# Patient Record
Sex: Female | Born: 1970 | Race: Black or African American | Hispanic: No | Marital: Married | Smoking: Never smoker
Health system: Southern US, Community
[De-identification: ages and names within clinical notes are randomized; demographics above are authoritative.]

## PROBLEM LIST (undated history)

## (undated) DIAGNOSIS — K8020 Calculus of gallbladder without cholecystitis without obstruction: Secondary | ICD-10-CM

## (undated) DIAGNOSIS — C55 Malignant neoplasm of uterus, part unspecified: Secondary | ICD-10-CM

## (undated) HISTORY — PX: THORACOTOMY/LOBECTOMY: SHX6116

## (undated) HISTORY — PX: ABDOMINAL HYSTERECTOMY: SHX81

---

## 2010-07-22 ENCOUNTER — Ambulatory Visit: Payer: Self-pay | Admitting: Diagnostic Radiology

## 2010-07-22 ENCOUNTER — Emergency Department (HOSPITAL_BASED_OUTPATIENT_CLINIC_OR_DEPARTMENT_OTHER): Admission: EM | Admit: 2010-07-22 | Discharge: 2010-07-22 | Payer: Self-pay | Admitting: Emergency Medicine

## 2010-10-02 ENCOUNTER — Emergency Department (HOSPITAL_BASED_OUTPATIENT_CLINIC_OR_DEPARTMENT_OTHER): Admission: EM | Admit: 2010-10-02 | Discharge: 2010-09-25 | Payer: Self-pay | Admitting: Emergency Medicine

## 2011-01-06 LAB — URINALYSIS, ROUTINE W REFLEX MICROSCOPIC
Leukocytes, UA: NEGATIVE
Protein, ur: NEGATIVE mg/dL
Urobilinogen, UA: 1 mg/dL (ref 0.0–1.0)

## 2011-01-06 LAB — URINE MICROSCOPIC-ADD ON

## 2011-01-06 LAB — DIFFERENTIAL
Basophils Absolute: 0.1 10*3/uL (ref 0.0–0.1)
Eosinophils Absolute: 0.4 10*3/uL (ref 0.0–0.7)
Lymphocytes Relative: 48 % — ABNORMAL HIGH (ref 12–46)
Lymphs Abs: 7 10*3/uL — ABNORMAL HIGH (ref 0.7–4.0)
Monocytes Absolute: 0.9 10*3/uL (ref 0.1–1.0)
Neutro Abs: 6 10*3/uL (ref 1.7–7.7)

## 2011-01-06 LAB — COMPREHENSIVE METABOLIC PANEL
ALT: 30 U/L (ref 0–35)
Alkaline Phosphatase: 88 U/L (ref 39–117)
CO2: 28 mEq/L (ref 19–32)
Calcium: 9.6 mg/dL (ref 8.4–10.5)
GFR calc non Af Amer: >60 mL/min (ref >60)
Glucose, Bld: 108 mg/dL — ABNORMAL HIGH (ref 70–99)
Sodium: 143 mEq/L (ref 135–145)
Total Bilirubin: 0.5 mg/dL (ref 0.3–1.2)

## 2011-01-06 LAB — CBC
HCT: 39 % (ref 36.0–46.0)
Hemoglobin: 13.3 g/dL (ref 12.0–15.0)
MCH: 31 pg (ref 26.0–34.0)
MCHC: 34 g/dL (ref 30.0–36.0)

## 2011-01-08 LAB — COMPREHENSIVE METABOLIC PANEL
ALT: 414 U/L — ABNORMAL HIGH (ref 0–35)
AST: 354 U/L — ABNORMAL HIGH (ref 0–37)
Alkaline Phosphatase: 177 U/L — ABNORMAL HIGH (ref 39–117)
CO2: 24 mEq/L (ref 19–32)
GFR calc Af Amer: >60 mL/min (ref >60)
GFR calc non Af Amer: >60 mL/min (ref >60)
Glucose, Bld: 86 mg/dL (ref 70–99)
Potassium: 4.3 mEq/L (ref 3.5–5.1)
Sodium: 140 mEq/L (ref 135–145)
Total Protein: 8 g/dL (ref 6.0–8.3)

## 2011-01-08 LAB — CBC
HCT: 40.3 % (ref 36.0–46.0)
Hemoglobin: 13.2 g/dL (ref 12.0–15.0)
WBC: 5.8 10*3/uL (ref 4.0–10.5)

## 2011-01-08 LAB — URINALYSIS, ROUTINE W REFLEX MICROSCOPIC
Glucose, UA: NEGATIVE mg/dL
Ketones, ur: 15 mg/dL — AB
Protein, ur: NEGATIVE mg/dL

## 2011-01-08 LAB — DIFFERENTIAL
Basophils Relative: 1 % (ref 0–1)
Eosinophils Absolute: 0.2 10*3/uL (ref 0.0–0.7)
Eosinophils Relative: 4 % (ref 0–5)
Monocytes Relative: 9 % (ref 3–12)
Neutrophils Relative %: 45 % (ref 43–77)

## 2011-01-08 LAB — URINE MICROSCOPIC-ADD ON

## 2011-12-14 IMAGING — US US ABDOMEN COMPLETE
1 series · 14 of 25 positions shown · non-contrast
Comparison: None

CLINICAL DATA: Abdominal pain.

COMPLETE ABDOMINAL ULTRASOUND

[Series 1: us abdomen complete · 0.28mm/px · 14 of 80 slices shown]
[im 1/80]
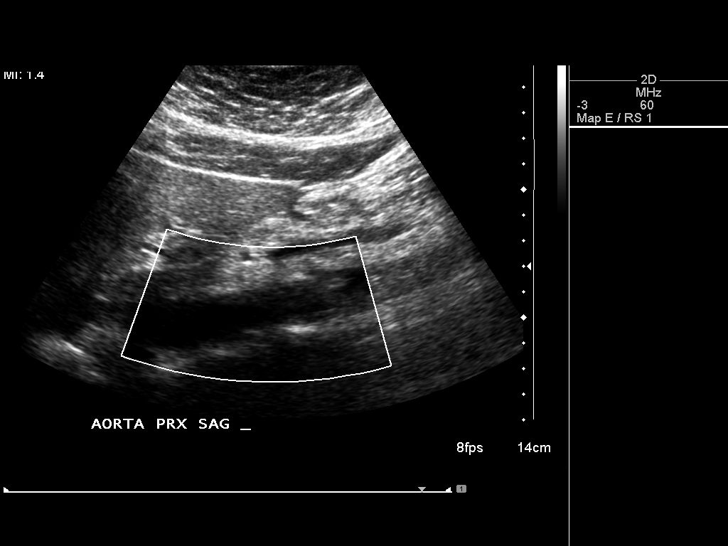
[im 7/80]
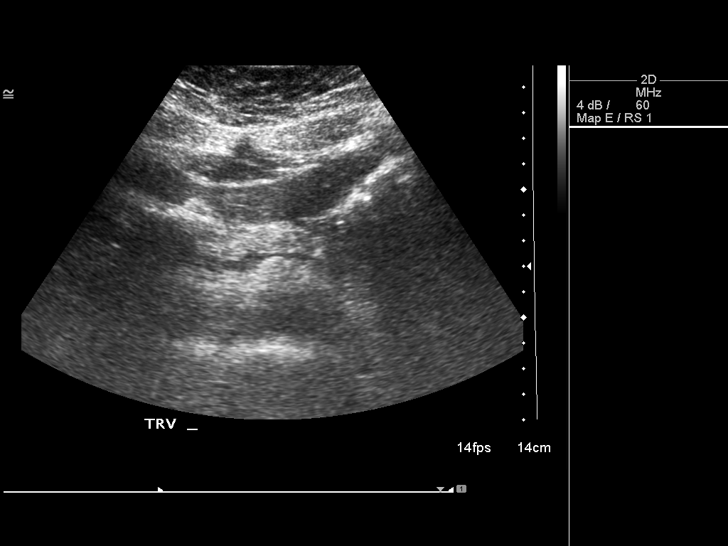
[im 14/80]
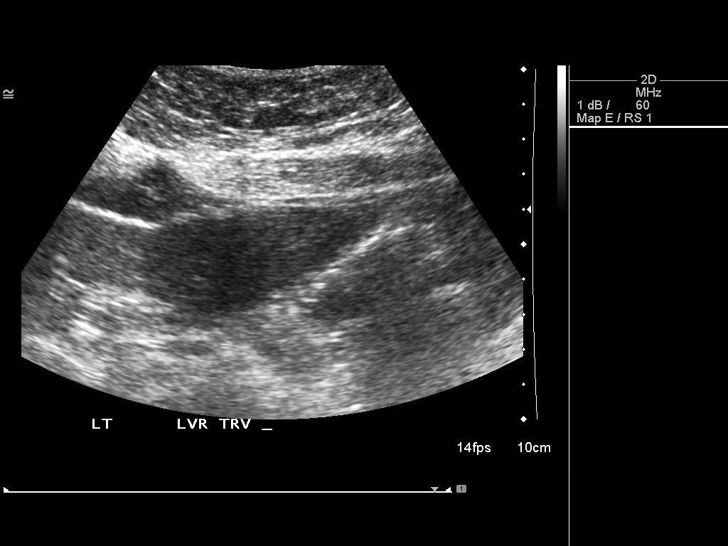
[im 20/80]
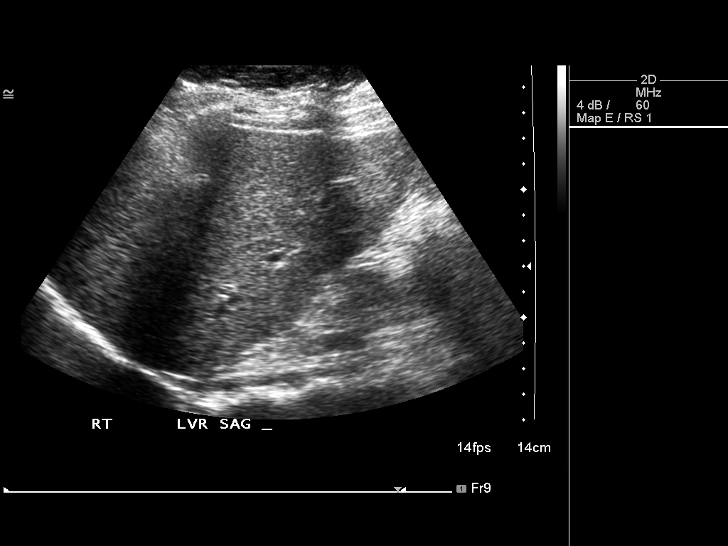
[im 27/80]
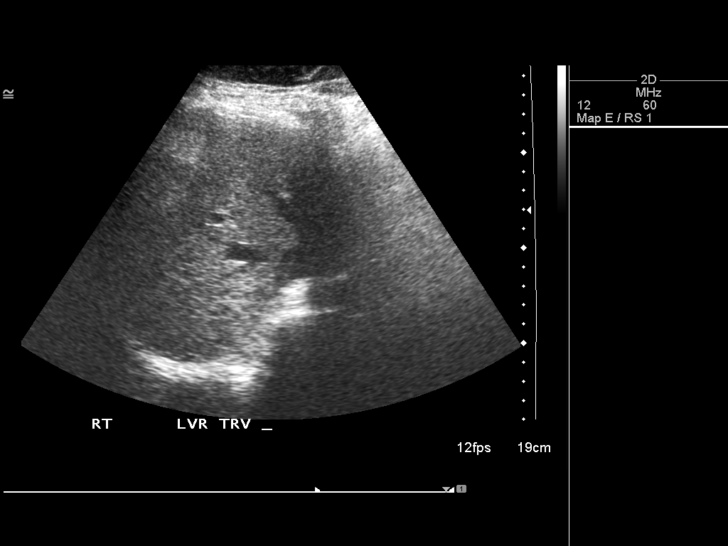
[im 30/80]
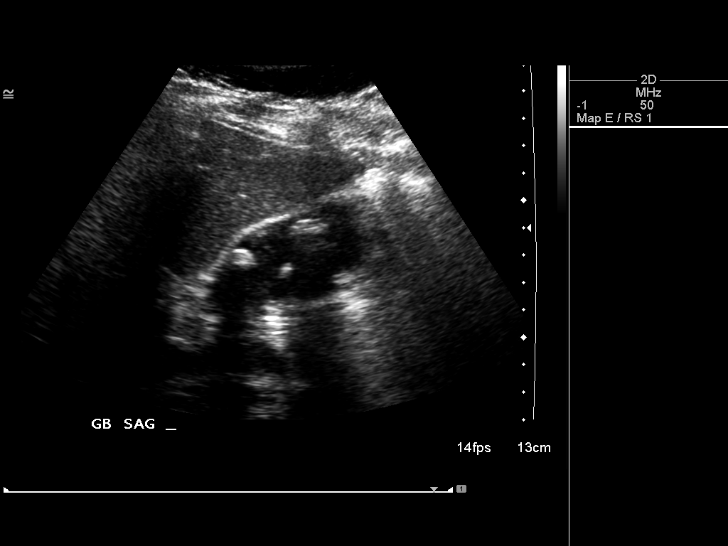
[im 37/80]
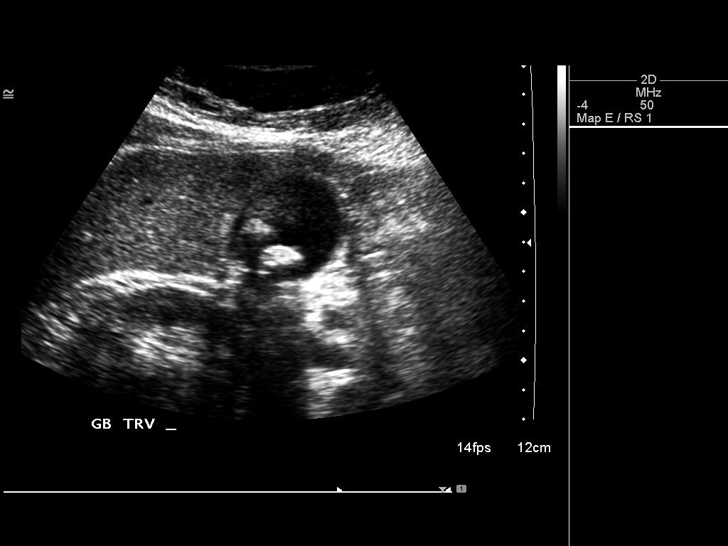
[im 43/80]
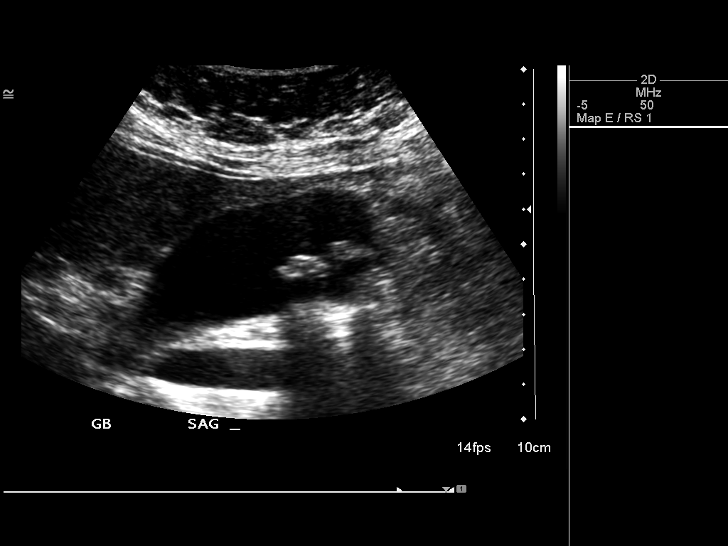
[im 50/80]
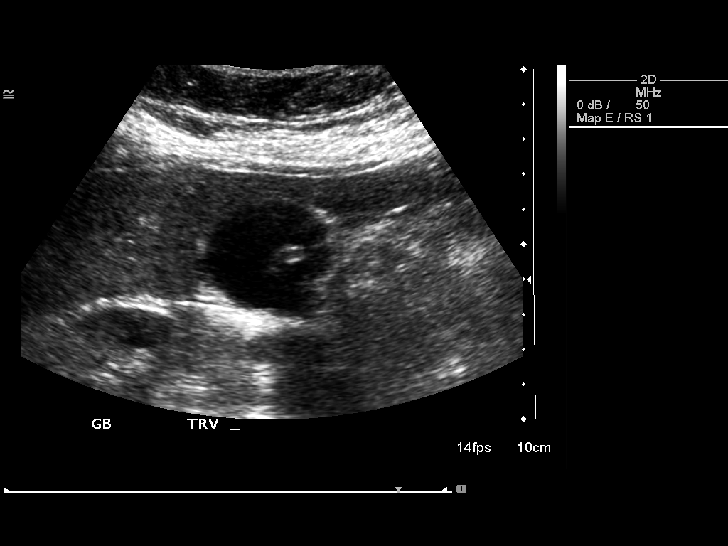
[im 53/80]
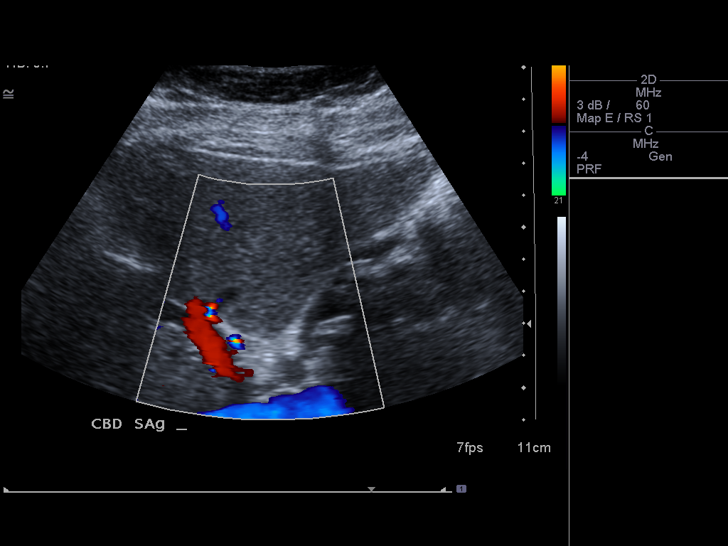
[im 60/80]
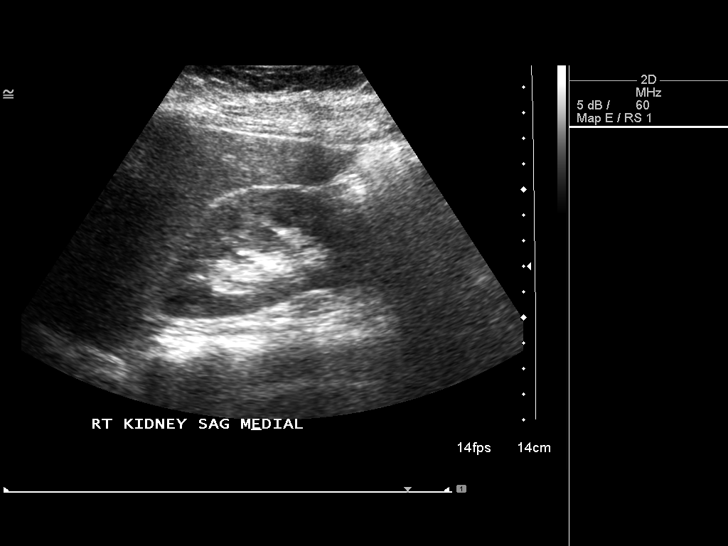
[im 66/80]
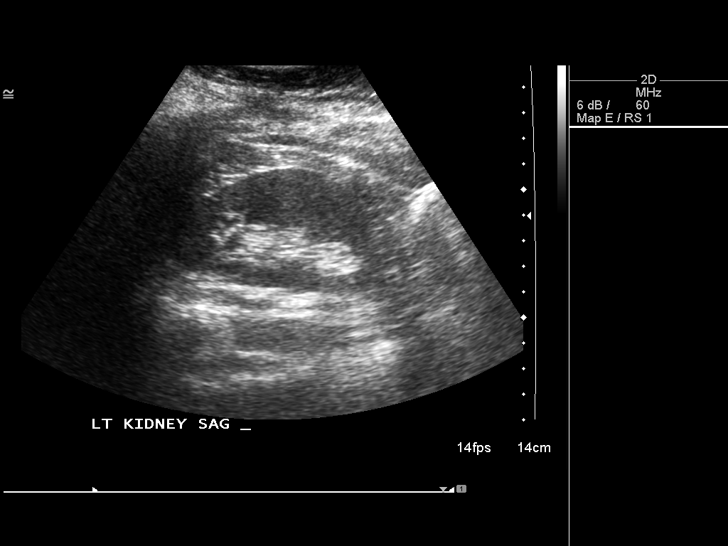
[im 73/80]
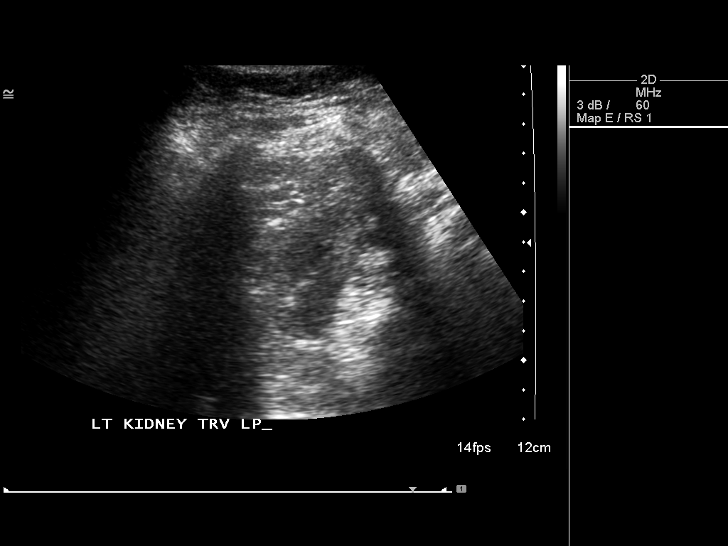
[im 80/80]
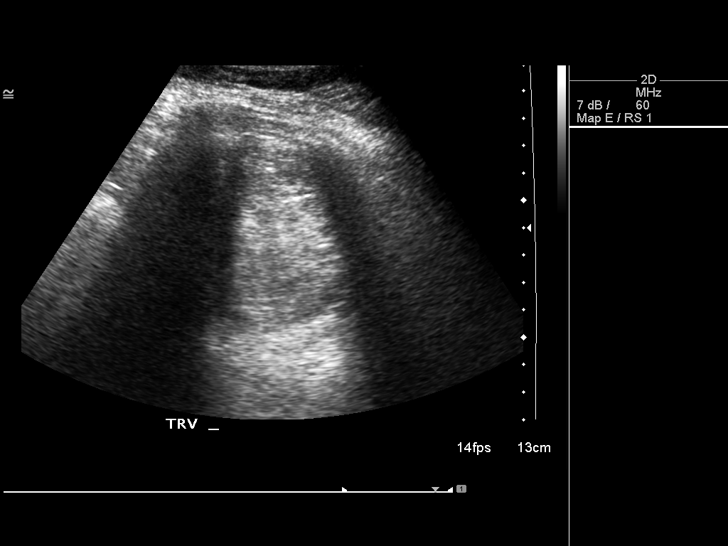

[14 of 25 positions shown; findings below may reference images not displayed]

FINDINGS: Gallbladder:  Multiple gallstones noted in the gallbladder.  The
largest measures approximately 18 mm in diameter.  No wall
thickening.

Common bile duct:   Within normal limits in caliber.

Liver:  No focal lesion identified.  Within normal limits in
parenchymal echogenicity.

IVC:  Appears normal.

Pancreas:  No focal abnormality seen.

Spleen:  Within normal limits in size and echotexture.

Right Kidney:   Normal in size and parenchymal echogenicity.  No
evidence of mass or hydronephrosis.

Left Kidney:  Normal in size and parenchymal echogenicity.  No
evidence of mass or hydronephrosis.

Abdominal aorta:  No aneurysm identified.
IMPRESSION: Cholelithiasis.  No sonographic evidence of acute cholecystitis.

## 2014-05-22 ENCOUNTER — Emergency Department (HOSPITAL_BASED_OUTPATIENT_CLINIC_OR_DEPARTMENT_OTHER)
Admission: EM | Admit: 2014-05-22 | Discharge: 2014-05-22 | Disposition: A | Discharge status: Home / Self Care | Payer: Non-veteran care | Attending: Emergency Medicine | Admitting: Emergency Medicine

## 2014-05-22 ENCOUNTER — Emergency Department (HOSPITAL_BASED_OUTPATIENT_CLINIC_OR_DEPARTMENT_OTHER): Payer: Non-veteran care

## 2014-05-22 ENCOUNTER — Encounter (HOSPITAL_BASED_OUTPATIENT_CLINIC_OR_DEPARTMENT_OTHER): Payer: Self-pay | Admitting: Emergency Medicine

## 2014-05-22 DIAGNOSIS — Z791 Long term (current) use of non-steroidal anti-inflammatories (NSAID): Secondary | ICD-10-CM | POA: Insufficient documentation

## 2014-05-22 DIAGNOSIS — K8050 Calculus of bile duct without cholangitis or cholecystitis without obstruction: Secondary | ICD-10-CM

## 2014-05-22 DIAGNOSIS — Z8542 Personal history of malignant neoplasm of other parts of uterus: Secondary | ICD-10-CM | POA: Insufficient documentation

## 2014-05-22 DIAGNOSIS — K8020 Calculus of gallbladder without cholecystitis without obstruction: Secondary | ICD-10-CM | POA: Insufficient documentation

## 2014-05-22 DIAGNOSIS — Z79899 Other long term (current) drug therapy: Secondary | ICD-10-CM | POA: Insufficient documentation

## 2014-05-22 DIAGNOSIS — Z90710 Acquired absence of both cervix and uterus: Secondary | ICD-10-CM | POA: Insufficient documentation

## 2014-05-22 DIAGNOSIS — R1011 Right upper quadrant pain: Secondary | ICD-10-CM | POA: Insufficient documentation

## 2014-05-22 HISTORY — DX: Calculus of gallbladder without cholecystitis without obstruction: K80.20

## 2014-05-22 HISTORY — DX: Malignant neoplasm of uterus, part unspecified: C55

## 2014-05-22 LAB — CBC WITH DIFFERENTIAL/PLATELET
Basophils Absolute: 0 10*3/uL (ref 0.0–0.1)
Basophils Relative: 1 % (ref 0–1)
Eosinophils Absolute: 0.2 10*3/uL (ref 0.0–0.7)
Eosinophils Relative: 2 % (ref 0–5)
HCT: 39.1 % (ref 36.0–46.0)
Hemoglobin: 13.2 g/dL (ref 12.0–15.0)
LYMPHS ABS: 2.4 10*3/uL (ref 0.7–4.0)
LYMPHS PCT: 34 % (ref 12–46)
MCH: 30.4 pg (ref 26.0–34.0)
MCHC: 33.8 g/dL (ref 30.0–36.0)
MCV: 90.1 fL (ref 78.0–100.0)
Monocytes Absolute: 0.5 10*3/uL (ref 0.1–1.0)
Monocytes Relative: 6 % (ref 3–12)
NEUTROS ABS: 4 10*3/uL (ref 1.7–7.7)
NEUTROS PCT: 57 % (ref 43–77)
PLATELETS: 323 10*3/uL (ref 150–400)
RBC: 4.34 MIL/uL (ref 3.87–5.11)
RDW: 12.7 % (ref 11.5–15.5)
WBC: 7.1 10*3/uL (ref 4.0–10.5)

## 2014-05-22 LAB — BASIC METABOLIC PANEL
Anion gap: 13 (ref 5–15)
BUN: 18 mg/dL (ref 6–23)
CO2: 24 meq/L (ref 19–32)
Calcium: 10.1 mg/dL (ref 8.4–10.5)
Chloride: 104 mEq/L (ref 96–112)
Creatinine, Ser: 0.8 mg/dL (ref 0.50–1.10)
GFR calc Af Amer: >90 mL/min (ref >90)
GFR, EST NON AFRICAN AMERICAN: 89 mL/min — AB (ref >90)
GLUCOSE: 115 mg/dL — AB (ref 70–99)
POTASSIUM: 4.2 meq/L (ref 3.7–5.3)
SODIUM: 141 meq/L (ref 137–147)

## 2014-05-22 LAB — URINALYSIS, ROUTINE W REFLEX MICROSCOPIC
Bilirubin Urine: NEGATIVE
Glucose, UA: NEGATIVE mg/dL
Ketones, ur: NEGATIVE mg/dL
Leukocytes, UA: NEGATIVE
NITRITE: NEGATIVE
Protein, ur: NEGATIVE mg/dL
SPECIFIC GRAVITY, URINE: 1.016 (ref 1.005–1.030)
UROBILINOGEN UA: 0.2 mg/dL (ref 0.0–1.0)
pH: 8 (ref 5.0–8.0)

## 2014-05-22 LAB — HEPATIC FUNCTION PANEL
ALT: 10 U/L (ref 0–35)
AST: 17 U/L (ref 0–37)
Albumin: 3.9 g/dL (ref 3.5–5.2)
Alkaline Phosphatase: 69 U/L (ref 39–117)
Bilirubin, Direct: <0.2 mg/dL (ref 0.0–0.3)
TOTAL PROTEIN: 7.9 g/dL (ref 6.0–8.3)
Total Bilirubin: 0.3 mg/dL (ref 0.3–1.2)

## 2014-05-22 LAB — URINE MICROSCOPIC-ADD ON

## 2014-05-22 LAB — LIPASE, BLOOD: LIPASE: 17 U/L (ref 11–59)

## 2014-05-22 MED ORDER — ONDANSETRON HCL 4 MG/2ML IJ SOLN
4.0000 mg | Freq: Once | INTRAMUSCULAR | Status: AC
  Administered 2014-05-22: 4 mg via INTRAVENOUS

## 2014-05-22 MED ORDER — ONDANSETRON HCL 4 MG PO TABS: 4.0000 mg | ORAL_TABLET | Freq: Four times a day (QID) | ORAL | Status: AC

## 2014-05-22 MED ORDER — OXYCODONE-ACETAMINOPHEN 5-325 MG PO TABS: 1.0000 | ORAL_TABLET | Freq: Four times a day (QID) | ORAL | Status: AC | PRN

## 2014-05-22 MED ORDER — SODIUM CHLORIDE 0.9 % IV BOLUS (SEPSIS)
1000.0000 mL | Freq: Once | INTRAVENOUS | Status: AC
  Administered 2014-05-22: 1000 mL via INTRAVENOUS

## 2014-05-22 MED ORDER — HYDROMORPHONE HCL PF 1 MG/ML IJ SOLN
1.0000 mg | Freq: Once | INTRAMUSCULAR | Status: AC
  Administered 2014-05-22: 1 mg via INTRAVENOUS
  Filled 2014-05-22: qty 1

## 2014-05-22 MED ORDER — ONDANSETRON HCL 4 MG/2ML IJ SOLN
INTRAMUSCULAR | Status: AC
  Administered 2014-05-22: 4 mg via INTRAVENOUS
  Filled 2014-05-22: qty 2

## 2014-05-22 MED ORDER — MORPHINE SULFATE 4 MG/ML IJ SOLN
4.0000 mg | Freq: Once | INTRAMUSCULAR | Status: AC
  Administered 2014-05-22: 4 mg via INTRAVENOUS
  Filled 2014-05-22: qty 1

## 2014-05-22 NOTE — ED Notes (Signed)
Patient states she has a history of gallstones which was diagnosed in 2014.  States she made dietary changes and has not had any additional problems.  States over the last two weeks, she has had several intermittent attacks.  At 0230 am today, she developed right upper quadrant abdominal pain with extreme nausea.

## 2014-05-22 NOTE — Discharge Instructions (Signed)
Return to the ED with any concerns including vomiting and not able to keep down liquids, fever/chills, pain not controlled by pain medications, decreased level of alertness/lethargy, or any other alarming symptoms

## 2014-05-22 NOTE — ED Provider Notes (Signed)
CSN: [id number]     Arrival date & time 05/22/14  0848 History   First MD Initiated Contact with Patient 05/22/14 0920     Chief Complaint  Patient presents with  . Abdominal Pain     (Consider location/radiation/quality/duration/timing/severity/associated sxs/prior Treatment) HPI Pt presenting with c/o right upper abdominal pain.  Pt states the pain has been intermittent over the past 2 weeks.  Asociated with nausea, no vomiting.  Latest symptom began in the middle of the night.  No fever/chills.  No vomiting.  Pain is aching in nature.  No dysuria or changes in stools.  Pt was diagnosed with gallstones in 2014.  There are no other associated systemic symptoms, there are no other alleviating or modifying factors.   Past Medical History  Diagnosis Date  . Gall stones   . Uterine cancer    Past Surgical History  Procedure Laterality Date  . Abdominal hysterectomy    . Thoracotomy/lobectomy      cancer meta   No family history on file. History  Substance Use Topics  . Smoking status: Never Smoker   . Smokeless tobacco: Not on file  . Alcohol Use: No   OB History   Grav Para Term Preterm Abortions TAB SAB Ect Mult Living                 Review of Systems ROS reviewed and all otherwise negative except for mentioned in HPI    Allergies  Review of patient's allergies indicates no known allergies.  Home Medications   Prior to Admission medications   Medication Sig Start Date End Date Taking? Authorizing Provider  naproxen sodium (ANAPROX) 220 MG tablet Take 220 mg by mouth 2 (two) times daily with a meal.   Yes Historical Provider, MD  ondansetron (ZOFRAN) 4 MG tablet Take 1 tablet (4 mg total) by mouth every 6 (six) hours. 05/22/14   Threasa Beards, MD  oxyCODONE-acetaminophen (PERCOCET/ROXICET) 5-325 MG per tablet Take 1-2 tablets by mouth every 6 (six) hours as needed for severe pain. 05/22/14   Threasa Beards, MD   BP 138/84  Pulse 76  Temp(Src) 98 F (36.7 C)  (Oral)  Resp 18  Ht 5' 4.5" (1.638 m)  Wt 199 lb (90.266 kg)  BMI 33.64 kg/m2  SpO2 100% Vitals reviewed Physical Exam Physical Examination: General appearance - alert, well appearing, and in no distress Mental status - alert, oriented to person, place, and time Eyes - no scleral icterus, no conjunctival injection Mouth - mucous membranes moist, pharynx normal without lesions Chest - clear to auscultation, no wheezes, rales or rhonchi, symmetric air entry Heart - normal rate, regular rhythm, normal S1, S2, no murmurs, rubs, clicks or gallops Abdomen - soft, mild ttp in right upper abdomen, no gaurding or rebound tenderness, nondistended, no masses or organomegaly Extremities - peripheral pulses normal, no pedal edema, no clubbing or cyanosis Skin - normal coloration and turgor, no rashes  ED Course  Procedures (including critical care time) Labs Review Labs Reviewed  URINALYSIS, ROUTINE W REFLEX MICROSCOPIC - Abnormal; Notable for the following:    APPearance TURBID (*)    Hgb urine dipstick TRACE (*)    All other components within normal limits  BASIC METABOLIC PANEL - Abnormal; Notable for the following:    Glucose, Bld 115 (*)    GFR calc non Af Amer 89 (*)    All other components within normal limits  URINE MICROSCOPIC-ADD ON - Abnormal; Notable for the following:  Bacteria, UA FEW (*)    All other components within normal limits  CBC WITH DIFFERENTIAL  LIPASE, BLOOD  HEPATIC FUNCTION PANEL    Imaging Review No results found.   EKG Interpretation None      MDM   Final diagnoses:  Biliary colic  Gallstone    Pt with known gallstones presents with right upper abdominal pain.  Labs reassuring, ultrasound shows stones without evidence of choleycystitis or acute obstruction.  Pt feels improved after pain and nausea meds in the ED.  Given information for surgery followup.  Discharged with strict return precautions.  Pt agreeable with plan.    Threasa Beards,  MD 05/24/14 [phone]

## 2014-05-22 NOTE — ED Notes (Signed)
MD at bedside. 

## 2015-10-14 IMAGING — US US ABDOMEN COMPLETE
1 series · 14 of 25 positions shown · non-contrast
Comparison: Abdominal ultrasound 07/22/2010.

CLINICAL DATA: Right upper quadrant pain and nausea.  Gallstones.

EXAM:
ULTRASOUND ABDOMEN COMPLETE

[Series 1: us abdomen complete · 0.35mm/px · 14 of 86 slices shown]
[im 1/86]
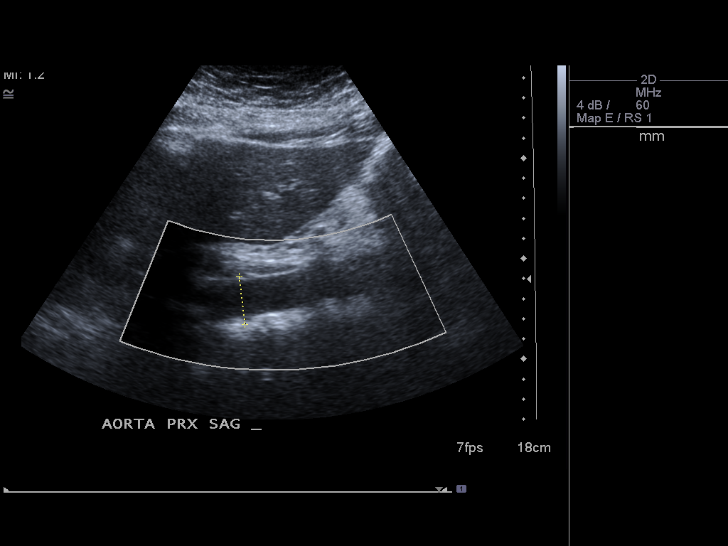
[im 8/86]
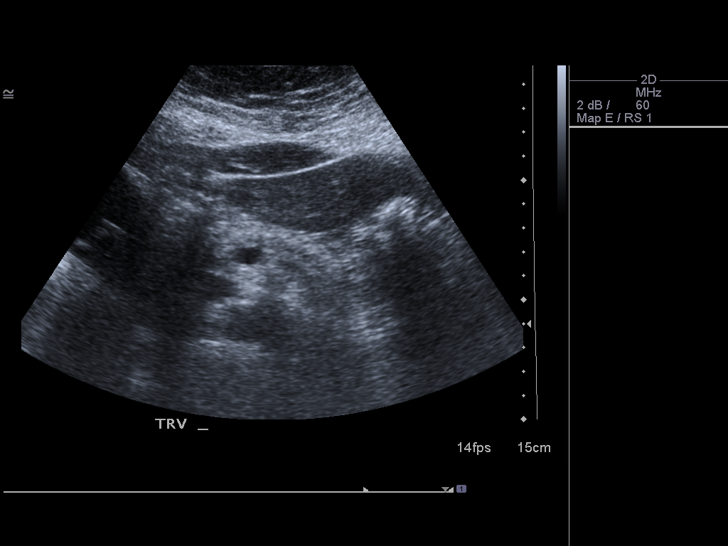
[im 15/86]
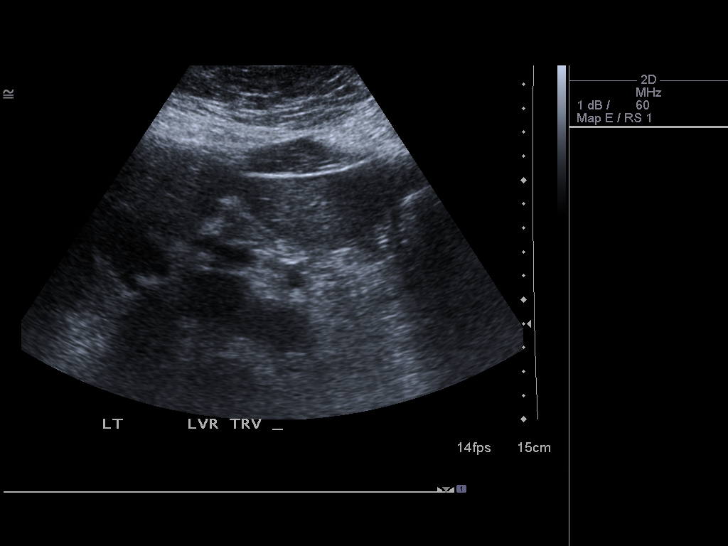
[im 22/86]
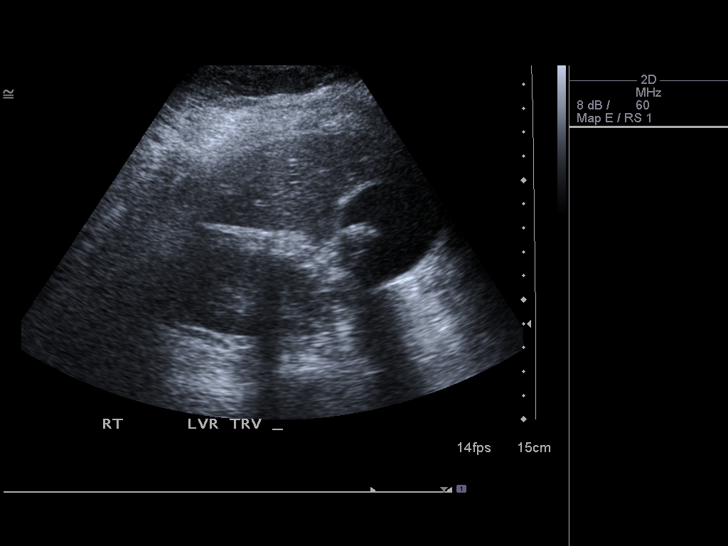
[im 29/86]
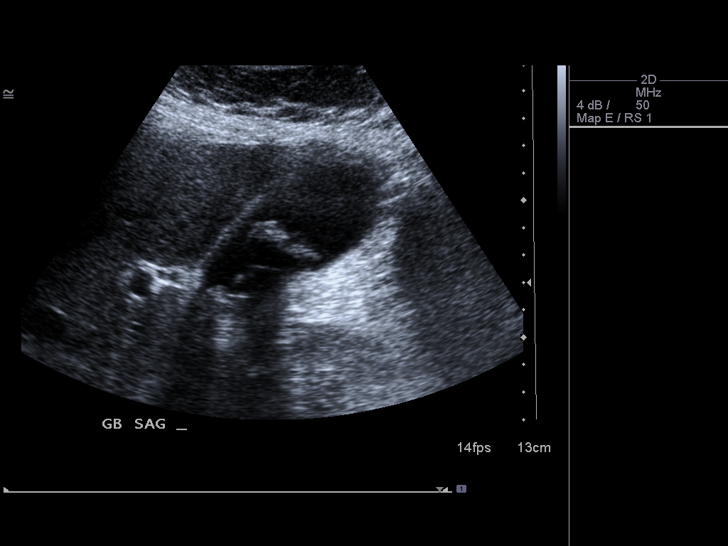
[im 32/86]
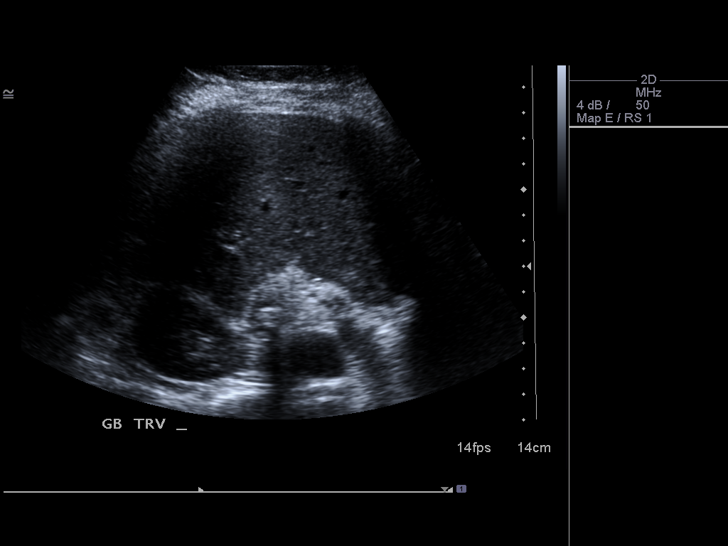
[im 39/86]
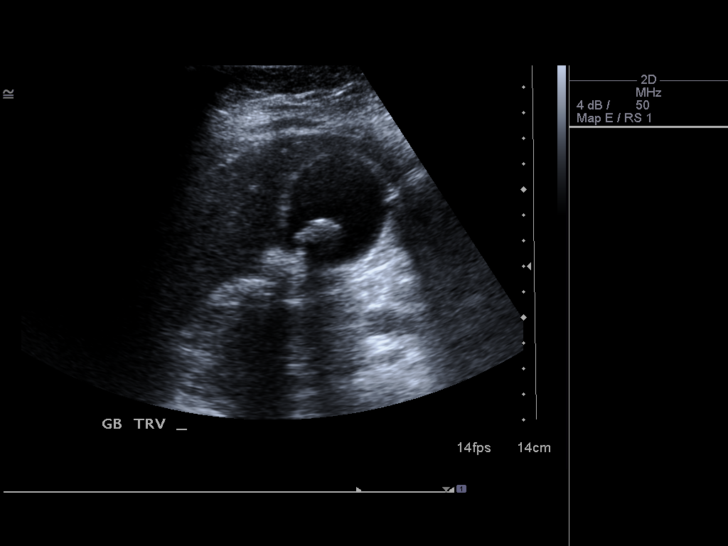
[im 47/86]
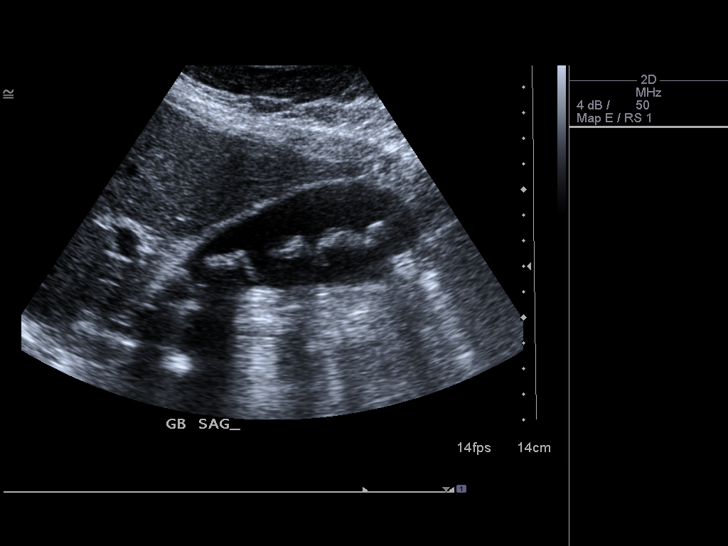
[im 54/86]
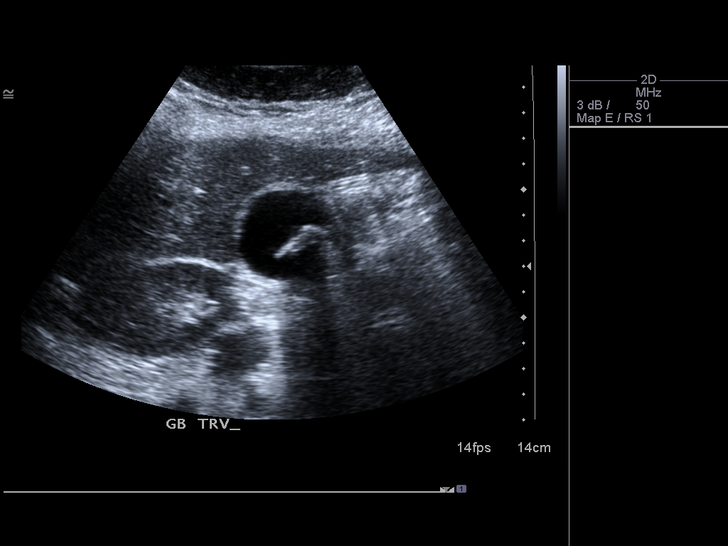
[im 57/86]
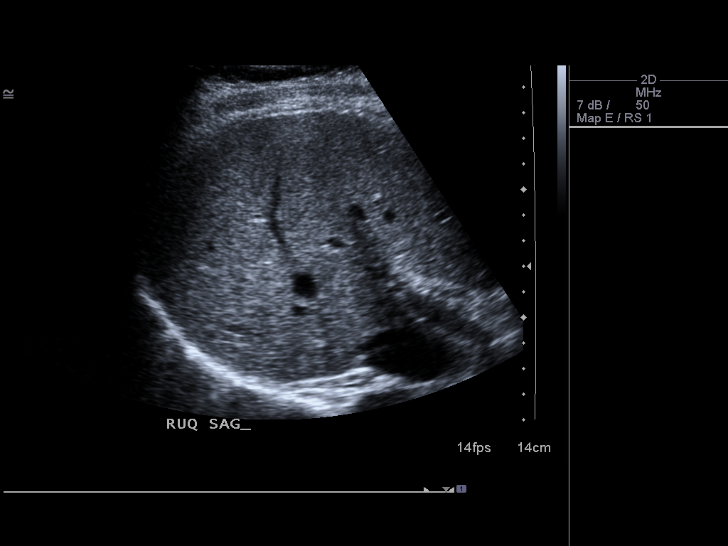
[im 64/86]
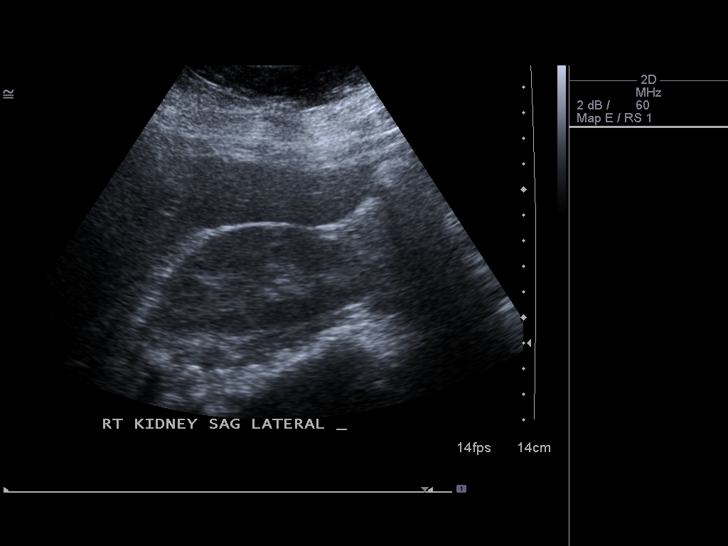
[im 71/86]
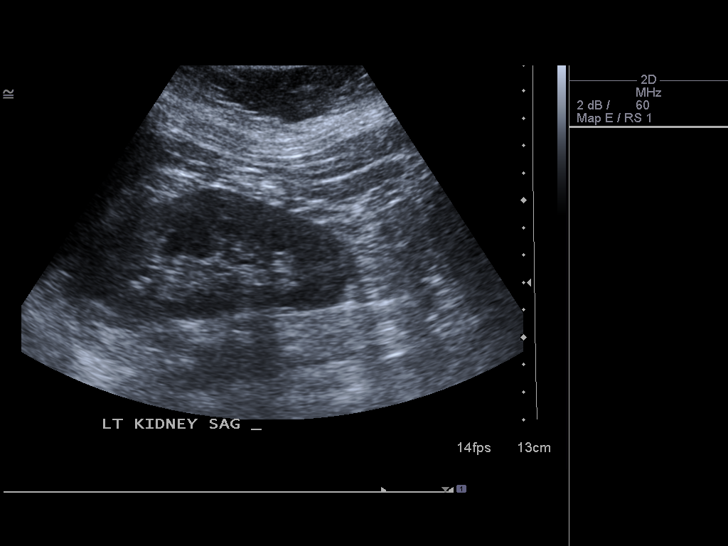
[im 78/86]
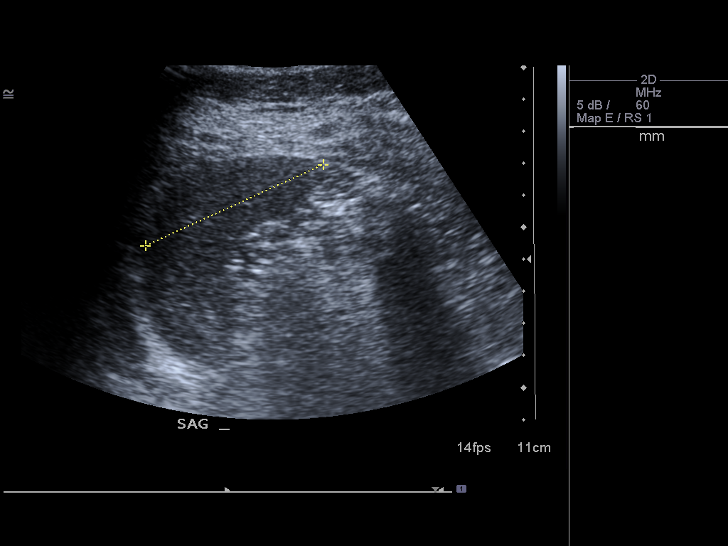
[im 86/86]
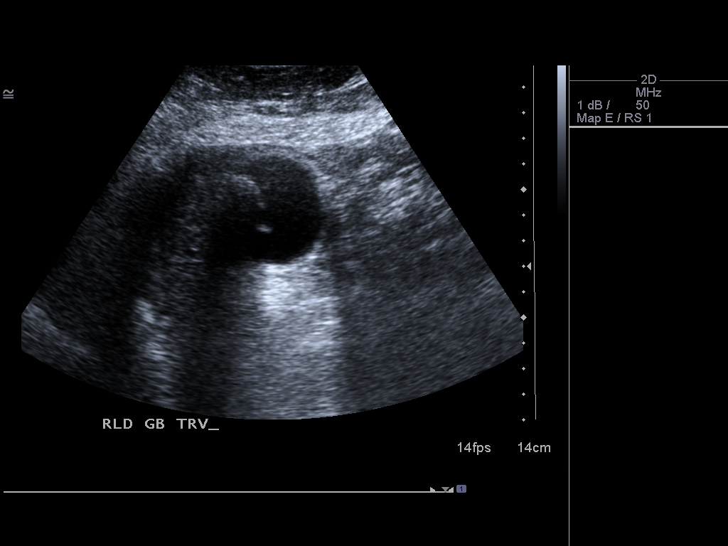

[14 of 25 positions shown; findings below may reference images not displayed]

FINDINGS: Gallbladder:

Multiple mobile gallstones are identified measuring up to 2.3 cm in
diameter. There is no pericholecystic fluid or wall thickening.

Common bile duct:

Diameter: 0.6 cm

Liver:

No focal lesion identified. Within normal limits in parenchymal
echogenicity.

IVC:

No abnormality visualized.

Pancreas:

Visualized portion unremarkable.

Spleen:

Size and appearance within normal limits.

Right Kidney:

Length: 10.5 cm. Echogenicity within normal limits. No mass or
hydronephrosis visualized.

Left Kidney:

Length: 10.2 cm. Echogenicity within normal limits. No mass or
hydronephrosis visualized.

Abdominal aorta:

No aneurysm visualized.

Other findings:

None.
IMPRESSION: The study is positive for gallstones without ultrasound evidence of
cholecystitis. Stable compared to prior examination.
